# Patient Record
Sex: Male | Born: 1960 | Race: White | Hispanic: No | Smoking: Former smoker
Health system: Southern US, Community
[De-identification: ages and names within clinical notes are randomized; demographics above are authoritative.]

## PROBLEM LIST (undated history)

## (undated) DIAGNOSIS — E7800 Pure hypercholesterolemia, unspecified: Secondary | ICD-10-CM

## (undated) HISTORY — PX: CHOLECYSTECTOMY: SHX55

---

## 2008-03-03 ENCOUNTER — Emergency Department (HOSPITAL_BASED_OUTPATIENT_CLINIC_OR_DEPARTMENT_OTHER): Admission: EM | Admit: 2008-03-03 | Discharge: 2008-03-03 | Payer: Self-pay | Admitting: Emergency Medicine

## 2009-10-12 ENCOUNTER — Encounter: Admission: RE | Admit: 2009-10-12 | Discharge: 2009-10-12 | Payer: Self-pay | Admitting: Family Medicine

## 2010-01-17 ENCOUNTER — Encounter: Admission: RE | Admit: 2010-01-17 | Discharge: 2010-01-17 | Payer: Self-pay | Admitting: Family Medicine

## 2010-03-02 ENCOUNTER — Encounter: Admission: RE | Admit: 2010-03-02 | Discharge: 2010-03-02 | Payer: Self-pay | Admitting: Family Medicine

## 2010-04-06 ENCOUNTER — Encounter: Admission: RE | Admit: 2010-04-06 | Discharge: 2010-04-06 | Payer: Self-pay | Admitting: Family Medicine

## 2010-08-14 ENCOUNTER — Other Ambulatory Visit: Payer: Self-pay | Admitting: Family Medicine

## 2010-08-14 DIAGNOSIS — M542 Cervicalgia: Secondary | ICD-10-CM

## 2010-08-17 ENCOUNTER — Ambulatory Visit
Admission: RE | Admit: 2010-08-17 | Discharge: 2010-08-17 | Disposition: A | Discharge status: Home / Self Care | Payer: BC Managed Care – PPO | Source: Ambulatory Visit | Attending: Family Medicine | Admitting: Family Medicine

## 2010-08-17 DIAGNOSIS — M542 Cervicalgia: Secondary | ICD-10-CM

## 2011-02-19 ENCOUNTER — Emergency Department (HOSPITAL_BASED_OUTPATIENT_CLINIC_OR_DEPARTMENT_OTHER)
Admission: EM | Admit: 2011-02-19 | Discharge: 2011-02-19 | Disposition: A | Discharge status: Home / Self Care | Payer: BC Managed Care – PPO | Attending: Emergency Medicine | Admitting: Emergency Medicine

## 2011-02-19 ENCOUNTER — Emergency Department (INDEPENDENT_AMBULATORY_CARE_PROVIDER_SITE_OTHER): Payer: BC Managed Care – PPO

## 2011-02-19 ENCOUNTER — Other Ambulatory Visit: Payer: Self-pay

## 2011-02-19 ENCOUNTER — Encounter: Payer: Self-pay | Admitting: *Deleted

## 2011-02-19 DIAGNOSIS — R1013 Epigastric pain: Secondary | ICD-10-CM | POA: Insufficient documentation

## 2011-02-19 DIAGNOSIS — R079 Chest pain, unspecified: Secondary | ICD-10-CM | POA: Insufficient documentation

## 2011-02-19 DIAGNOSIS — R109 Unspecified abdominal pain: Secondary | ICD-10-CM

## 2011-02-19 DIAGNOSIS — E7800 Pure hypercholesterolemia, unspecified: Secondary | ICD-10-CM | POA: Insufficient documentation

## 2011-02-19 HISTORY — DX: Pure hypercholesterolemia, unspecified: E78.00

## 2011-02-19 LAB — DIFFERENTIAL
Basophils Absolute: 0 10*3/uL (ref 0.0–0.1)
Lymphocytes Relative: 30 % (ref 12–46)
Monocytes Relative: 9 % (ref 3–12)
Neutro Abs: 5.8 10*3/uL (ref 1.7–7.7)
Neutrophils Relative %: 60 % (ref 43–77)

## 2011-02-19 LAB — COMPREHENSIVE METABOLIC PANEL
ALT: 51 U/L (ref 0–53)
AST: 54 U/L — ABNORMAL HIGH (ref 0–37)
CO2: 25 mEq/L (ref 19–32)
Calcium: 9.5 mg/dL (ref 8.4–10.5)
Chloride: 103 mEq/L (ref 96–112)
GFR calc non Af Amer: >60 mL/min (ref >60)
Glucose, Bld: 103 mg/dL — ABNORMAL HIGH (ref 70–99)
Total Bilirubin: 0.3 mg/dL (ref 0.3–1.2)
Total Protein: 7 g/dL (ref 6.0–8.3)

## 2011-02-19 LAB — CBC
MCHC: 35.5 g/dL (ref 30.0–36.0)
Platelets: 223 10*3/uL (ref 150–400)
RBC: 5.09 MIL/uL (ref 4.22–5.81)
RDW: 12.3 % (ref 11.5–15.5)

## 2011-02-19 LAB — URINALYSIS, ROUTINE W REFLEX MICROSCOPIC
Bilirubin Urine: NEGATIVE
Hgb urine dipstick: NEGATIVE
Ketones, ur: NEGATIVE mg/dL
Protein, ur: NEGATIVE mg/dL
Urobilinogen, UA: 0.2 mg/dL (ref 0.0–1.0)

## 2011-02-19 LAB — LIPASE, BLOOD: Lipase: 41 U/L (ref 11–59)

## 2011-02-19 MED ORDER — DOCUSATE SODIUM 100 MG PO CAPS: 100.0000 mg | ORAL_CAPSULE | Freq: Two times a day (BID) | ORAL | Status: AC

## 2011-02-19 MED ORDER — FAMOTIDINE 20 MG PO TABS: 20.0000 mg | ORAL_TABLET | Freq: Two times a day (BID) | ORAL | Status: DC

## 2011-02-19 NOTE — ED Provider Notes (Signed)
History    Scribed for Forbes Cellar, MD, the patient was seen in room MH01/MH01. This chart was scribed by Clarita Crane. This patient's care was started at 6:01PM  CSN: [id number] Arrival date & time: 02/19/2011  4:57 PM  Chief Complaint  Patient presents with  . Abdominal Pain   HPI Patient is a 50 year old male c/o sudden onset episodic upper abdominal pain described as sharp and cramping with associated diaphoresis. Patient reports he had an episode of abdominal pain this morning several minutes following breakfast and an additional episode this afternoon 5 minutes following a meal of spaghetti. Patient states both episodes lasted approximately 5-10 minutes and then resolved on their own. Denies nausea, vomiting, back pain, dysuria, hematuria. Patient denies previous history of similar symptoms.  Reports last and only BM today was this morning and states he usually has 3 BMs a day. Reports h/o hyperlipidemia.  Patient is an occasional drinker, former smoker and denies drug abuse. No blood in stool  PAST MEDICAL HISTORY:  Past Medical History  Diagnosis Date  . High cholesterol     PAST SURGICAL HISTORY:  History reviewed. No pertinent past surgical history.  MEDICATIONS:  Previous Medications   ACYCLOVIR (ZOVIRAX) 400 MG TABLET    Take 400 mg by mouth 3 (three) times daily as needed. Fever blister    ALPRAZOLAM (XANAX) 0.5 MG TABLET    Take 0.5 mg by mouth at bedtime.     AMITRIPTYLINE (ELAVIL) 10 MG TABLET    Take 20 mg by mouth at bedtime.     ASPIRIN 81 MG EC TABLET    Take 81 mg by mouth daily.     EZETIMIBE (ZETIA) 10 MG TABLET    Take 10 mg by mouth daily.     MULTIPLE VITAMIN (MULTIVITAMIN) TABLET    Take 1 tablet by mouth daily.     NUTRITIONAL SUPPLEMENTS (PROSTA VITE PO)    Take 1 tablet by mouth daily.     PITAVASTATIN CALCIUM (LIVALO) 2 MG TABS    Take 1 tablet by mouth daily.       ALLERGIES:  Allergies as of 02/19/2011 - Review Complete 02/19/2011  Allergen  Reaction Noted  . Niaspan (niacin (antihyperlipidemic)) Hives and Itching 02/19/2011     FAMILY HISTORY:  No family history on file.   SOCIAL HISTORY: History   Social History  . Marital Status: Divorced    Spouse Name: N/A    Number of Children: N/A  . Years of Education: N/A   Social History Main Topics  . Smoking status: Former Games developer  . Smokeless tobacco: None  . Alcohol Use: Yes  . Drug Use: No  . Sexually Active:    Other Topics Concern  . None   Social History Narrative  . None     Review of Systems 10 Systems reviewed and are negative for acute change except as noted in the HPI.  Physical Exam  BP 130/75  Pulse 85  Temp(Src) 97.9 F (36.6 C) (Oral)  Resp 20  SpO2 99%  Physical Exam  Nursing note and vitals reviewed. Constitutional: He is oriented to person, place, and time. Vital signs are normal. He appears well-developed and well-nourished.  HENT:  Head: Normocephalic and atraumatic.       Oropharynx clear  Eyes: Conjunctivae are normal. Pupils are equal, round, and reactive to light.  Neck: Neck supple.  Cardiovascular: Normal rate, regular rhythm and normal heart sounds.  Exam reveals no gallop and no friction  rub.   No murmur heard. Pulmonary/Chest: Effort normal and breath sounds normal. He has no wheezes. He has no rales.       No rhonchi  Abdominal: Soft. Bowel sounds are normal. He exhibits no distension. There is no tenderness. There is no CVA tenderness.  Musculoskeletal: Normal range of motion. He exhibits no edema.  Neurological: He is alert and oriented to person, place, and time. No sensory deficit.  Skin: Skin is warm and dry.  Psychiatric: He has a normal mood and affect. His behavior is normal.    ED Course  Procedures  OTHER DATA REVIEWED: Nursing notes, vital signs, and past medical records reviewed. All labs/vitals reviewed and considered Imaging results reviewed and considered   DIAGNOSTIC STUDIES:   LABS /  RADIOLOGY: Results for orders placed during the hospital encounter of 02/19/11  CBC      Component Value Range   WBC 9.7  4.0 - 10.5 (K/uL)   RBC 5.09  4.22 - 5.81 (MIL/uL)   Hemoglobin 15.6  13.0 - 17.0 (g/dL)   HCT 16.1  09.6 - 04.5 (%)   MCV 86.4  78.0 - 100.0 (fL)   MCH 30.6  26.0 - 34.0 (pg)   MCHC 35.5  30.0 - 36.0 (g/dL)   RDW 40.9  81.1 - 91.4 (%)   Platelets 223  150 - 400 (K/uL)  DIFFERENTIAL      Component Value Range   Neutrophils Relative 60  43 - 77 (%)   Neutro Abs 5.8  1.7 - 7.7 (K/uL)   Lymphocytes Relative 30  12 - 46 (%)   Lymphs Abs 2.9  0.7 - 4.0 (K/uL)   Monocytes Relative 9  3 - 12 (%)   Monocytes Absolute 0.8  0.1 - 1.0 (K/uL)   Eosinophils Relative 1  0 - 5 (%)   Eosinophils Absolute 0.1  0.0 - 0.7 (K/uL)   Basophils Relative 0  0 - 1 (%)   Basophils Absolute 0.0  0.0 - 0.1 (K/uL)  COMPREHENSIVE METABOLIC PANEL      Component Value Range   Sodium 140  135 - 145 (mEq/L)   Potassium 3.9  3.5 - 5.1 (mEq/L)   Chloride 103  96 - 112 (mEq/L)   CO2 25  19 - 32 (mEq/L)   Glucose, Bld 103 (*) 70 - 99 (mg/dL)   BUN 15  6 - 23 (mg/dL)   Creatinine, Ser 7.82  0.50 - 1.35 (mg/dL)   Calcium 9.5  8.4 - 95.6 (mg/dL)   Total Protein 7.0  6.0 - 8.3 (g/dL)   Albumin 4.2  3.5 - 5.2 (g/dL)   AST 54 (*) 0 - 37 (U/L)   ALT 51  0 - 53 (U/L)   Alkaline Phosphatase 81  39 - 117 (U/L)   Total Bilirubin 0.3  0.3 - 1.2 (mg/dL)   GFR calc non Af Amer >60  >60 (mL/min)   GFR calc Af Amer >60  >60 (mL/min)  LIPASE, BLOOD      Component Value Range   Lipase 41  11 - 59 (U/L)  URINALYSIS, ROUTINE W REFLEX MICROSCOPIC      Component Value Range   Color, Urine YELLOW  YELLOW    Appearance CLOUDY (*) CLEAR    Specific Gravity, Urine 1.023  1.005 - 1.030    pH 6.5  5.0 - 8.0    Glucose, UA NEGATIVE  NEGATIVE (mg/dL)   Hgb urine dipstick NEGATIVE  NEGATIVE    Bilirubin Urine NEGATIVE  NEGATIVE    Ketones, ur NEGATIVE  NEGATIVE (mg/dL)   Protein, ur NEGATIVE  NEGATIVE (mg/dL)    Urobilinogen, UA 0.2  0.0 - 1.0 (mg/dL)   Nitrite NEGATIVE  NEGATIVE    Leukocytes, UA NEGATIVE  NEGATIVE    Dg Abd Acute W/chest  02/19/2011  *RADIOLOGY REPORT*  Clinical Data: Abdominal and chest pain  ACUTE ABDOMEN SERIES (ABDOMEN 2 VIEW & CHEST 1 VIEW)  Comparison: 01/17/2010 chest x-ray  Findings: Normal heart size and vascularity.  Negative for pneumonia, edema, effusion or pneumothorax.  Midline trachea.  Nonobstructive bowel gas pattern.  No significant dilatation, ileus or free air.  Pelvic calcifications likely venous phleboliths.  No acute osseous finding.  IMPRESSION: No acute finding.  Original Report Authenticated By: Judie Petit. Ruel Favors, M.D.    Date: 02/19/2011  Rate: 72  Rhythm: normal sinus rhythm  QRS Axis: normal  Intervals: normal  ST/T Wave abnormalities: nonspecific T wave changes  Conduction Disutrbances:none  Narrative Interpretation:   Old EKG Reviewed: none available  PROCEDURES:  ED COURSE / COORDINATION OF CARE: 7:42 PM  Pt continues to remain pain free in ER. Tolerating PO without pain. Feels comfortable with discharge, has PMD f/u one week. Precautions for return. Home with pepcid and colace   MDM: Differential Diagnosis: gastritis, gastric ulcer, GI spasm, biliary colic, less likely cardiac in nature    PLAN: discharge The patient is to return the emergency department if there is any worsening of symptoms. I have reviewed the discharge instructions with the patient/family   CONDITION ON DISCHARGE: Stable,    MEDICATIONS GIVEN IN THE E.D.  Medications  aspirin 81 MG EC tablet (not administered)  Nutritional Supplements (PROSTA VITE PO) (not administered)  Multiple Vitamin (MULTIVITAMIN) tablet (not administered)  ezetimibe (ZETIA) 10 MG tablet (not administered)  Pitavastatin Calcium (LIVALO) 2 MG TABS (not administered)  amitriptyline (ELAVIL) 10 MG tablet (not administered)  ALPRAZolam (XANAX) 0.5 MG tablet (not administered)  acyclovir  (ZOVIRAX) 400 MG tablet (not administered)     I personally performed the services described in this documentation, which was scribed in my presence. The recorded information has been reviewed and considered. Forbes Cellar, MD     Forbes Cellar, MD 02/19/11 [phone]

## 2011-02-19 NOTE — ED Notes (Signed)
Sudden onset of epigastric pain this am after eating breakfast. Resolved with time. Same thing happened after eating lunch. His MD is out of town and he was told to come here.

## 2011-02-19 NOTE — ED Notes (Signed)
Pt given crackers, peanut butter, and soda per MD order for PO challenge.

## 2011-02-20 ENCOUNTER — Other Ambulatory Visit: Payer: Self-pay | Admitting: Family Medicine

## 2011-02-20 DIAGNOSIS — R1010 Upper abdominal pain, unspecified: Secondary | ICD-10-CM

## 2011-02-20 DIAGNOSIS — R61 Generalized hyperhidrosis: Secondary | ICD-10-CM

## 2011-02-21 ENCOUNTER — Observation Stay (HOSPITAL_COMMUNITY)
Admission: EM | Admit: 2011-02-21 | Discharge: 2011-02-23 | DRG: 419 | Disposition: A | Discharge status: Home / Self Care | Payer: BC Managed Care – PPO | Attending: General Surgery | Admitting: General Surgery

## 2011-02-21 ENCOUNTER — Ambulatory Visit
Admission: RE | Admit: 2011-02-21 | Discharge: 2011-02-21 | Disposition: A | Discharge status: Home / Self Care | Payer: BC Managed Care – PPO | Source: Ambulatory Visit | Attending: Family Medicine | Admitting: Family Medicine

## 2011-02-21 DIAGNOSIS — Z79899 Other long term (current) drug therapy: Secondary | ICD-10-CM | POA: Insufficient documentation

## 2011-02-21 DIAGNOSIS — R109 Unspecified abdominal pain: Secondary | ICD-10-CM | POA: Insufficient documentation

## 2011-02-21 DIAGNOSIS — K8010 Calculus of gallbladder with chronic cholecystitis without obstruction: Principal | ICD-10-CM | POA: Insufficient documentation

## 2011-02-21 DIAGNOSIS — E785 Hyperlipidemia, unspecified: Secondary | ICD-10-CM | POA: Diagnosis present

## 2011-02-21 DIAGNOSIS — R61 Generalized hyperhidrosis: Secondary | ICD-10-CM

## 2011-02-21 DIAGNOSIS — R1011 Right upper quadrant pain: Secondary | ICD-10-CM

## 2011-02-21 DIAGNOSIS — R1010 Upper abdominal pain, unspecified: Secondary | ICD-10-CM

## 2011-02-21 DIAGNOSIS — Z7982 Long term (current) use of aspirin: Secondary | ICD-10-CM | POA: Insufficient documentation

## 2011-02-21 LAB — COMPREHENSIVE METABOLIC PANEL
ALT: 171 U/L — ABNORMAL HIGH (ref 0–53)
Albumin: 4.3 g/dL (ref 3.5–5.2)
Alkaline Phosphatase: 131 U/L — ABNORMAL HIGH (ref 39–117)
Chloride: 102 mEq/L (ref 96–112)
Creatinine, Ser: 0.89 mg/dL (ref 0.50–1.35)
Total Protein: 7.3 g/dL (ref 6.0–8.3)

## 2011-02-21 LAB — DIFFERENTIAL
Basophils Absolute: 0 10*3/uL (ref 0.0–0.1)
Eosinophils Relative: 1 % (ref 0–5)
Lymphs Abs: 2.3 10*3/uL (ref 0.7–4.0)
Monocytes Relative: 8 % (ref 3–12)
Neutrophils Relative %: 60 % (ref 43–77)

## 2011-02-21 LAB — CBC
MCH: 31.2 pg (ref 26.0–34.0)
MCHC: 35.8 g/dL (ref 30.0–36.0)
Platelets: 223 10*3/uL (ref 150–400)
WBC: 7.4 10*3/uL (ref 4.0–10.5)

## 2011-02-22 ENCOUNTER — Other Ambulatory Visit (INDEPENDENT_AMBULATORY_CARE_PROVIDER_SITE_OTHER): Payer: Self-pay | Admitting: General Surgery

## 2011-02-22 ENCOUNTER — Inpatient Hospital Stay (HOSPITAL_COMMUNITY): Payer: BC Managed Care – PPO

## 2011-02-22 DIAGNOSIS — K8010 Calculus of gallbladder with chronic cholecystitis without obstruction: Secondary | ICD-10-CM

## 2011-02-22 LAB — CBC
Hemoglobin: 14.6 g/dL (ref 13.0–17.0)
MCH: 29.7 pg (ref 26.0–34.0)
MCV: 88.6 fL (ref 78.0–100.0)
Platelets: 209 10*3/uL (ref 150–400)
RBC: 4.92 MIL/uL (ref 4.22–5.81)

## 2011-02-22 LAB — SURGICAL PCR SCREEN
MRSA, PCR: NEGATIVE
Staphylococcus aureus: NEGATIVE

## 2011-02-22 LAB — COMPREHENSIVE METABOLIC PANEL
CO2: 27 mEq/L (ref 19–32)
Calcium: 8.8 mg/dL (ref 8.4–10.5)
Creatinine, Ser: 0.91 mg/dL (ref 0.50–1.35)
GFR calc Af Amer: >60 mL/min (ref >60)
GFR calc non Af Amer: >60 mL/min (ref >60)
Glucose, Bld: 95 mg/dL (ref 70–99)

## 2011-02-24 NOTE — Op Note (Signed)
NAME:  Darren Martin, Darren Martin NO.:  [id number]  MEDICAL RECORD NO.:  [id number]  LOCATION:  1523                         FACILITY:  [facility]  PHYSICIAN:  Lodema Pilot, MD       DATE OF BIRTH:  1960-09-17  DATE OF PROCEDURE:  02/22/2011 DATE OF DISCHARGE:                              OPERATIVE REPORT   PROCEDURE:  Laparoscopic cholecystectomy with intraoperative cholangiogram.  PREOPERATIVE DIAGNOSIS:  Symptomatic cholelithiasis.  POSTOPERATIVE DIAGNOSIS:  Symptomatic cholelithiasis.  SURGEON:  Lodema Pilot, MD  ASSISTANT:  None.  ANESTHESIA:  General endotracheal anesthesia with 30 cc of 1% lidocaine with epinephrine and 0.25% Marcaine in a 50/50 mixture.  FLUIDS:  1350 cc of crystalloid.  ESTIMATED BLOOD LOSS:  Minimal.  DRAINS:  None.  SPECIMEN:  Gallbladder and contents sent to Pathology for permanent sectioning.  COMPLICATIONS:  None apparent.  FINDINGS:  Multiple small gallstones within the gallbladder, normal cholangiogram.  No other obvious intra-abdominal pathology was identified.  INDICATIONS FOR PROCEDURE:  Mr. Schroeter is a 50 year old male with persistent right upper quadrant pain after eating.  He was unable to tolerate p.o. due to episodic pain after eating.  He had an ultrasound, consistent with cholelithiasis.  He actually also had some elevated LFTs on his most recent labs.  OPERATIVE DETAILS:  Mr. Vercher was seen and evaluated in the preoperative area and risks and benefits of the procedure were again discussed in lay terms.  Informed consent was obtained. He was given prophylactic antibiotics, taken to the operating room, placed on table in supine position, and general endotracheal anesthesia was obtained.  His abdomen was prepped and draped in a standard surgical fashion and semicircular infraumbilical incision was made in the skin and dissection carried down to the subcutaneous tissue using blunt dissection.  The abdominal wall fascia  was elevated between Kocher clamps and sharply incised and the peritoneum was entered under direct visualization.  A 12 mm balloon trocar was placed at the umbilicus and pneumoperitoneum was obtained. Laparoscope was introduced into the abdomen and there was no evidence of bowel injury upon entry.  An 11 mm epigastric trocar was placed and two 5 mm right upper quadrant trocars were placed under direct visualization.  The gallbladder fundus was retracted cephalad and the peritoneum overlying the gallbladder was taken down using blunt dissection.  He did not appear to have acute cholecystitis, although he did have a thickened wall, but the surrounding tissue was not edematous or inflamed and the anatomy was clearly visualized.  The cystic duct and cystic artery were identified in the usual anatomic positions and the triangle of Calot was skeletonized, the cystic duct was skeletonized. The liver parenchyma was visualized through the triangle of Calot with a single cystic artery coursing up on to the gallbladder.  Clips were placed on the artery prior to the cholangiogram, however, the artery was not divided.  A clip was placed on the cystic duct on the gallbladder side and cholangiogram catheter was placed to a 14-gauge angiocatheter through the abdominal wall and a small cystic ductotomy was made.  The catheter was placed into the cystic duct and cholangiogram was performed.  There was normal filling of  the right and left hepatic ducts.  No common bile duct filling defects and there was free flow of bowel into the duodenum, although he did appear to have a dilated common bile duct.  The cholangiogram catheter was removed and clips were placed on the cystic duct and the duct was transected as well as the artery that had already been clipped.  The gallbladder was removed from the gallbladder fossa using Bovie electrocautery and it was removed from the umbilicus in an EndoCatch bag that was  opened up on the back table and he did have multiple gallstones by the single cystic duct.  Specimen was passed off the table and sent to Pathology for permanent sectioning. During the dissection from the gallbladder fossa, the clip on the gallbladder side of the cystic duct had come off and a few stones had leaked out and these were removed using a stone forceps.  The gallbladder fossa was inspected for hemostasis which was noted to be adequate and the clips appeared to be in good position.  The right upper quadrant was irrigated with sterile saline solution until irrigation returned clear.  There was no evidence of any residual stones. The right upper quadrant trocar was removed under visualization and the umbilical trocars were removed and the fascia was closed using interrupted 0 Vicryl sutures.  The abdomen was re-insufflated and the camera placed through the epigastric trocar and the umbilical closure was noted to be adequate without any evidence of bowel injury.  The right upper quadrant appeared to be hemostatic and no evidence of bowel injury.  Epigastric trocar was removed and the wounds were injected with total of 30 cc of 1% lidocaine with epinephrine and 0.25% Marcaine in 50/50 mixture.  The skin edges were approximated with 4-0 Monocryl subcuticular suture.  The skin was washed and dried and Dermabond was applied.  All sponge, needle, and instrument counts were correct at the end of the case.  The patient tolerated procedure well and was ready for transfer to recovery room in stable condition.          ______________________________ Lodema Pilot, MD     BL/MEDQ  D:  02/22/2011  T:  02/23/2011  Job:  [id number]  Electronically Signed by Lodema Pilot DO on 02/24/2011 06:02:56 PM

## 2011-03-06 NOTE — H&P (Signed)
NAME:  Darren Martin, KUNZ NO.:  [id number]  MEDICAL RECORD NO.:  [id number]  LOCATION:  WLED                         FACILITY:  [facility]  PHYSICIAN:  Lodema Pilot, MD       DATE OF BIRTH:  Oct 18, 1960  DATE OF ADMISSION:  02/21/2011 DATE OF DISCHARGE:                             HISTORY & PHYSICAL   REFERRING PHYSICIAN:  Mosetta Putt, M.D.  REASON FOR CONSULT:  Abdominal pain and gallstones.  BRIEF HISTORY:  The patient is a 50 year old white male, who is normally in good health until Monday when he thought he was having a heart attack after eating.  The pain got better quickly and he felt better, but he went to the ER at [facility] and was ruled out from a cardiac standpoint.  Tuesday, August 7, he had pain again after breakfast that was sharp and lasted about 5-10 minutes.  Pain improved and then he was seen again today again with pain and underwent ultrasound which shows he had gallstones.  Now, he has been referred to the ER at [facility] and Dr. Biagio Quint for evaluation for cholecystectomy.  He reports having fullness after eating a poor appetite for about a week.  Currently, he is nontender and having no discomfort.  PAST MEDICAL HISTORY: 1. 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 2. Dyslipidemia.  PAST SURGICAL HISTORY:  None.  FAMILY HISTORY:  Mother is living in good health.  Father died with renal disease.  One brother and sister both in good health.  SOCIAL HISTORY:  Tobacco, none.  Alcohol, social.  Drugs, none.  He is married.  He is a Media planner.  REVIEW OF SYSTEMS:  FEVER:  None.  SKIN:  No changes.  PSYCH:  No changes.  WEIGHT:  Stable.  CV: No headaches, dizziness, syncope, or stroke.  PULMONARY:  He snores, but no other problems.  No orthopnea, PND, DOE.  CARDIAC:  No chest pain.  No palpitations.  GI: Positive for occasional GERD.  Positive for constipation.  No diarrhea.  No nausea or vomiting.  GU: No trouble  voiding.  LOWER EXTREMITIES: No edema. MUSCULOSKELETAL: Some joint problems.  CURRENT MEDICATIONS: 1. Zetia 10 mg daily. 2. Aspirin 81 mg daily. 3. Prosta-Response one daily. 4. Livalo 2 mg daily. 5. Multivitamin one daily. 6. Elavil 20 mg h.s. 7. Alprazolam 0.5 mg at h.s. 8. Acyclovir 400 p.r.n. for fever blisters.  ALLERGIES:  NIASPAN, he had hives and trouble breathing.  He has been statin intolerant.  He has been on several others with muscular problems with them.  PHYSICAL EXAMINATION:  GENERAL:  This is a well-nourished, well- developed white male in no acute distress. VITAL SIGNS:  He is 90.9 kg, temperature is 97.8, heart rate is 81, blood pressure is 141/85, respiratory rate of 16, sats are 99% on room air. EARS, NOSE, THROAT AND MOUTH:  Within normal limits. NECK:  Trachea is in midline.  No bruits.  No JVD.  No thyromegaly. RESPIRATORY:  Effort is normal. CHEST: Clear to auscultation and percussion.  Chest is nontender to palpation CARDIAC:  No murmurs or rubs were heard.  Normal S1 and S2.  Pulses  are +2 and equal both the upper and lower extremities. ABDOMEN:  Soft, positive bowel sounds.  No palpable hepatosplenomegaly. He is nontender, nondistended.  No abscesses, hernia, or masses. GU/RECTAL:  Deferred. LYMPHADENOPATHY:  None palpated in femoral, axillary, or cervical. MUSCULOSKELETAL:  No joint changes. SKIN:  No changes. NEUROLOGIC:  Cranial nerves II-XII intact.  No focal defects. PSYCH:  Normal affect.  LABORATORY DATA:  His white count on August 6, is 9.7.  Hemoglobin 15.6, hematocrit 44, platelets are 223,000.  UA is negative.  Total bilirubin was 0.3, alk phos was 81, SGOT is 54, SGPT is 51, these have been repeated and his total bilirubin is 0.5, alk phos is up to 131, SGOT up to 153 and SGPT is 171.  Electrolytes are normal.  BUN is 14, creatinine is 0.89.  Ultrasound shows cholelithiasis and the largest stone is up to 9 mm, gallbladder wall was  normal.  There is no fluid, Murphy sign was negative, common bile duct was dilated at 7.5 mm.  IMPRESSION: 1. Symptomatic cholelithiasis. 2. Elevated transaminases. 3. Dyslipidemia.  PLAN:  We are going to admit the patient, well hydrate him overnight and recheck a CMP in the morning, tentatively schedule for OR some time tomorrow.     Eber Hong, P.A.   ______________________________ Lodema Pilot, MD    WDJ/MEDQ  D:  02/21/2011  T:  02/21/2011  Job:  [id number]  cc:   Mosetta Putt, M.D. Fax: [phone]  Electronically Signed by Lodema Pilot DO on 03/06/2011 09:45:24 AM

## 2011-03-13 ENCOUNTER — Ambulatory Visit (INDEPENDENT_AMBULATORY_CARE_PROVIDER_SITE_OTHER): Payer: BC Managed Care – PPO | Admitting: Radiology

## 2011-03-13 ENCOUNTER — Encounter (INDEPENDENT_AMBULATORY_CARE_PROVIDER_SITE_OTHER): Payer: Self-pay | Admitting: Radiology

## 2011-03-13 DIAGNOSIS — K8010 Calculus of gallbladder with chronic cholecystitis without obstruction: Secondary | ICD-10-CM

## 2011-03-13 NOTE — Progress Notes (Signed)
Darren Martin is a 50 y.o. male who had a laparoscopic cholecystectomy with intraoperative cholangiogram.  The pathology report confirmed gallstones and cholecystitis.  The patient reports that they are feeling well with normal bowel movements and good appetite.  The pre-operative symptoms of abdominal pain, nausea, and vomiting have resolved.    Physical examination - Incisions appear well-healed with no sign of infection or bleeding.   Abdomen - soft, non-tender  Impression:  s/p laparoscopic cholecystectomy  Plan:  He may resume a regular diet and full activity.  He may follow-up on a PRN basis.

## 2011-06-12 ENCOUNTER — Other Ambulatory Visit: Payer: Self-pay | Admitting: Family Medicine

## 2011-06-12 ENCOUNTER — Ambulatory Visit
Admission: RE | Admit: 2011-06-12 | Discharge: 2011-06-12 | Disposition: A | Discharge status: Home / Self Care | Payer: BC Managed Care – PPO | Source: Ambulatory Visit | Attending: Family Medicine | Admitting: Family Medicine

## 2011-06-12 DIAGNOSIS — R059 Cough, unspecified: Secondary | ICD-10-CM

## 2011-06-12 DIAGNOSIS — R0602 Shortness of breath: Secondary | ICD-10-CM

## 2011-06-12 DIAGNOSIS — R05 Cough: Secondary | ICD-10-CM

## 2011-07-24 ENCOUNTER — Other Ambulatory Visit: Payer: Self-pay | Admitting: Family Medicine

## 2011-07-24 ENCOUNTER — Ambulatory Visit
Admission: RE | Admit: 2011-07-24 | Discharge: 2011-07-24 | Disposition: A | Discharge status: Home / Self Care | Payer: BC Managed Care – PPO | Source: Ambulatory Visit | Attending: Family Medicine | Admitting: Family Medicine

## 2011-07-24 DIAGNOSIS — M545 Low back pain, unspecified: Secondary | ICD-10-CM

## 2012-02-22 ENCOUNTER — Other Ambulatory Visit: Payer: Self-pay | Admitting: Family Medicine

## 2012-02-22 DIAGNOSIS — M545 Low back pain, unspecified: Secondary | ICD-10-CM

## 2012-02-27 ENCOUNTER — Ambulatory Visit
Admission: RE | Admit: 2012-02-27 | Discharge: 2012-02-27 | Disposition: A | Discharge status: Home / Self Care | Payer: 59 | Source: Ambulatory Visit | Attending: Family Medicine | Admitting: Family Medicine

## 2012-02-27 DIAGNOSIS — M545 Low back pain, unspecified: Secondary | ICD-10-CM

## 2012-02-29 ENCOUNTER — Other Ambulatory Visit: Payer: Self-pay | Admitting: Family Medicine

## 2012-02-29 ENCOUNTER — Ambulatory Visit
Admission: RE | Admit: 2012-02-29 | Discharge: 2012-02-29 | Disposition: A | Discharge status: Home / Self Care | Payer: 59 | Source: Ambulatory Visit | Attending: Family Medicine | Admitting: Family Medicine

## 2012-02-29 DIAGNOSIS — M545 Low back pain, unspecified: Secondary | ICD-10-CM

## 2012-02-29 MED ORDER — IOHEXOL 180 MG/ML  SOLN
1.0000 mL | Freq: Once | INTRAMUSCULAR | Status: AC | PRN
  Administered 2012-02-29: 1 mL via EPIDURAL

## 2012-02-29 MED ORDER — METHYLPREDNISOLONE ACETATE 40 MG/ML INJ SUSP (RADIOLOG
120.0000 mg | Freq: Once | INTRAMUSCULAR | Status: AC
  Administered 2012-02-29: 120 mg via EPIDURAL

## 2012-02-29 NOTE — Discharge Instructions (Signed)

## 2012-03-13 ENCOUNTER — Other Ambulatory Visit: Payer: Self-pay | Admitting: Family Medicine

## 2012-03-13 DIAGNOSIS — M5416 Radiculopathy, lumbar region: Secondary | ICD-10-CM

## 2012-03-24 ENCOUNTER — Other Ambulatory Visit: Payer: Self-pay | Admitting: Family Medicine

## 2012-03-24 ENCOUNTER — Ambulatory Visit
Admission: RE | Admit: 2012-03-24 | Discharge: 2012-03-24 | Disposition: A | Discharge status: Home / Self Care | Payer: 59 | Source: Ambulatory Visit | Attending: Family Medicine | Admitting: Family Medicine

## 2012-03-24 DIAGNOSIS — M5416 Radiculopathy, lumbar region: Secondary | ICD-10-CM

## 2012-03-24 MED ORDER — METHYLPREDNISOLONE ACETATE 40 MG/ML INJ SUSP (RADIOLOG
120.0000 mg | Freq: Once | INTRAMUSCULAR | Status: AC
  Administered 2012-03-24: 120 mg via EPIDURAL

## 2012-03-24 MED ORDER — IOHEXOL 180 MG/ML  SOLN
1.0000 mL | Freq: Once | INTRAMUSCULAR | Status: AC | PRN
  Administered 2012-03-24: 1 mL via EPIDURAL

## 2012-04-08 ENCOUNTER — Telehealth: Payer: Self-pay | Admitting: Radiology

## 2012-04-08 NOTE — Telephone Encounter (Signed)
Pt having more pain post injection and wanted to speak with Dr. Benard Rink. Dr. Benard Rink made aware and he will call pt later today.dd

## 2012-10-28 ENCOUNTER — Ambulatory Visit (HOSPITAL_BASED_OUTPATIENT_CLINIC_OR_DEPARTMENT_OTHER): Payer: 59

## 2012-11-04 ENCOUNTER — Ambulatory Visit (HOSPITAL_BASED_OUTPATIENT_CLINIC_OR_DEPARTMENT_OTHER): Payer: 59

## 2016-04-17 ENCOUNTER — Ambulatory Visit (INDEPENDENT_AMBULATORY_CARE_PROVIDER_SITE_OTHER): Payer: 59 | Admitting: Podiatry

## 2016-04-17 ENCOUNTER — Encounter: Payer: Self-pay | Admitting: Podiatry

## 2016-04-17 DIAGNOSIS — B070 Plantar wart: Secondary | ICD-10-CM

## 2016-04-17 NOTE — Progress Notes (Signed)
Subjective:     Patient ID: Darren Martin, male   DOB: 02/15/1961, 55 y.o.   MRN: [id number]  HPI 55 year old male presents the office with concerns of a wart on the bottom of his left big toe which is been ongoing for about 2 years. He previously did have the area frozen one time he is been using over-the-counter wart remover pads without any relief. He states that he feels is getting bigger and spreading somewhat. Denies any pain or drainage. No other complaints at this time.  Review of Systems  All other systems reviewed and are negative.      Objective:   Physical Exam General: AAO x3, NAD  Dermatological: On the plantar Aspect of the left talus is a hyperkeratotic lesion and upon debridement as pinpoint bleeding evidence of verruca. This is approximate 1.5 x 1.5 cm there's multiple lesions identified. As 2 satellite lesions in the periphery. There is no edema, erythema to the hallux. No other lesions identified bilaterally.  Vascular: Dorsalis Pedis artery and Posterior Tibial artery pedal pulses are 2/4 bilateral with immedate capillary fill time. Pedal hair growth present. There is no pain with calf compression, swelling, warmth, erythema.   Neruologic: Grossly intact via light touch bilateral. Vibratory intact via tuning fork bilateral. Protective threshold with Semmes Wienstein monofilament intact to all pedal sites bilateral.  Musculoskeletal: No gross boney pedal deformities bilateral. No pain, crepitus, or limitation noted with foot and ankle range of motion bilateral. Muscular strength 5/5 in all groups tested bilateral.  Gait: Unassisted, Nonantalgic.      Assessment:     Verruca on the left hallux    Plan:     -Treatment options discussed including all alternatives, risks, and complications -Etiology of symptoms were discussed -Lesion was debrided today without complications. The area was cleaned. Pad was placed followed by salicylic acid and a bandage. Post  procedure instructions were discussed. Follow-up in 3 weeks or sooner if needed. Call any questions or concerns.  Ovid CurdMatthew Rasheed Martin, DPM

## 2016-05-08 ENCOUNTER — Ambulatory Visit (INDEPENDENT_AMBULATORY_CARE_PROVIDER_SITE_OTHER): Payer: 59 | Admitting: Podiatry

## 2016-05-08 ENCOUNTER — Encounter: Payer: Self-pay | Admitting: Podiatry

## 2016-05-08 DIAGNOSIS — B070 Plantar wart: Secondary | ICD-10-CM

## 2016-05-08 NOTE — Patient Instructions (Signed)
Take dressing off in 8 hours and wash the foot with soap and water. If it is hurting or becomes uncomfortable before the 8 hours, go ahead and remove the bandage and wash the area.  If it blisters, apply antibiotic ointment and a band-aid.  Monitor for any signs/symptoms of infection. Call the office immediately if any occur or go directly to the emergency room. Call with any questions/concerns.   

## 2016-05-09 NOTE — Progress Notes (Signed)
Subjective: Patient presents today for follow-up evaluation of the left hallux wart. He believes it is spreading. He had a piece of skin fall off of the area the other day. No drainage or redness. No complications after the salinocaine treatment last appointment. Denies any systemic complaints such as fevers, chills, nausea, vomiting. No acute changes since last appointment, and no other complaints at this time.   Objective: AAO x3, NAD DP/PT pulses palpable bilaterally, CRT less than 3 seconds Hyperkerotic lesion to the left plantar hallux. Upon debridement there is evidence of verruca. There is a new small satellite lesion adjacent to this area. There is no surrounding erythema, edema, ascending cellulitis, drainage, posturing signs of infection No open lesions or pre-ulcerative lesions.  No pain with calf compression, swelling, warmth, erythema  Assessment: Verruca left hallux  Plan: -All treatment options discussed with the patient including all alternatives, risks, complications.  -Lesion was debrided and the area was cleaned. Cantharone was applied followed by an occlusive bandage. Post procedure instructions were discussed. Monitor for infection. -Follow-up 4 weeks or sooner if needed. -Patient encouraged to call the office with any questions, concerns, change in symptoms.   Ovid CurdMatthew Russie Gulledge, DPM

## 2016-06-12 ENCOUNTER — Ambulatory Visit: Payer: 59 | Admitting: Podiatry

## 2016-06-19 ENCOUNTER — Encounter: Payer: Self-pay | Admitting: Podiatry

## 2016-06-19 ENCOUNTER — Ambulatory Visit (INDEPENDENT_AMBULATORY_CARE_PROVIDER_SITE_OTHER): Payer: 59 | Admitting: Podiatry

## 2016-06-19 DIAGNOSIS — B070 Plantar wart: Secondary | ICD-10-CM | POA: Diagnosis not present

## 2016-06-19 NOTE — Progress Notes (Signed)
Subjective: Patient presents today for follow-up evaluation of the left hallux wart. He has not looked at it and not sure how it is doing but it does not hurt and he has had no redness, swelling or drainage. He had no complications after the last treatment. No new concerns today.  Denies any systemic complaints such as fevers, chills, nausea, vomiting. No acute changes since last appointment, and no other complaints at this time.   Objective: AAO x3, NAD DP/PT pulses palpable bilaterally, CRT less than 3 seconds Hyperkerotic lesion to the left plantar hallux. Upon debridement there is evidence of verruca. Area. She smaller diameter as well as more superficial. There is no edema, erythema or signs of infections of the hallux. There is no other lesions identified. No pain with calf compression, swelling, warmth, erythema  Assessment: Verruca left hallux  Plan: -All treatment options discussed with the patient including all alternatives, risks, complications.  -Lesion was debrided and the area was cleaned. A pad was placed followed by some salinocaine followed by an occlusive bandage. Post procedure instructions were discussed. Monitor for infection. -Follow-up 4 weeks or sooner if needed. -Patient encouraged to call the office with any questions, concerns, change in symptoms.   Ovid CurdMatthew Wagoner, DPM

## 2016-07-24 ENCOUNTER — Ambulatory Visit (INDEPENDENT_AMBULATORY_CARE_PROVIDER_SITE_OTHER): Payer: 59 | Admitting: Podiatry

## 2016-07-24 ENCOUNTER — Encounter: Payer: Self-pay | Admitting: Podiatry

## 2016-07-24 DIAGNOSIS — B070 Plantar wart: Secondary | ICD-10-CM

## 2016-07-24 NOTE — Progress Notes (Signed)
Subjective: [name] presents today for follow-up evaluation of the left hallux wart. He has not looked at the area. It has not been causing any pain. This has been ongoing for several years now. Before seeing me, he has tried various OTC treatments and has had it frozen twice.   No new concerns today.  Denies any systemic complaints such as fevers, chills, nausea, vomiting. No acute changes since last appointment, and no other complaints at this time.   Objective: AAO x3, NAD DP/PT pulses palpable bilaterally, CRT less than 3 seconds Hyperkerotic lesion to the left plantar hallux. Upon debridement there is evidence of verruca. There is one larger area that is improved and there is too satellite lesions. Upon debridement there was pinpoint bleeding.There is no edema, erythema or signs of infections of the hallux. There is no other lesions identified. No pain with calf compression, swelling, warmth, erythema  Assessment: Verruca left hallux  Plan: -All treatment options discussed with the patient including all alternatives, risks, complications.  -Lesion was debrided and the area was cleaned. A pad was placed followed by salinocaine and an occlusive bandage. Post procedure instructions were discussed. Monitor for infection. -Prescribed [facility] compound cream wart treatment.  -Follow-up 6 weeks or sooner if needed. -Patient encouraged to call the office with any questions, concerns, change in symptoms.   Darren CurdMatthew Martin, DPM

## 2016-07-25 MED ORDER — NONFORMULARY OR COMPOUNDED ITEM: 2 refills | Status: DC

## 2016-07-25 NOTE — Addendum Note (Signed)
Addended by: Alphia Kava'CONNELL, VALERY D on: 07/25/2016 08:32 AM   Modules accepted: Orders

## 2016-08-28 ENCOUNTER — Ambulatory Visit (INDEPENDENT_AMBULATORY_CARE_PROVIDER_SITE_OTHER): Payer: 59 | Admitting: Podiatry

## 2016-08-28 ENCOUNTER — Encounter: Payer: Self-pay | Admitting: Podiatry

## 2016-08-28 DIAGNOSIS — B070 Plantar wart: Secondary | ICD-10-CM

## 2016-08-28 NOTE — Progress Notes (Signed)
Subjective: [name] presents today for follow-up evaluation of the left hallux wart. He states it does not hurt and he does not look at the areas so he does not know how it is doing. He did get the compound cream but states he has not used it in a couple of weeks [place]. No swelling, redness.  No new concerns today.  Denies any systemic complaints such as fevers, chills, nausea, vomiting. No acute changes since last appointment, and no other complaints at this time.   Objective: AAO x3, NAD DP/PT pulses palpable bilaterally, CRT less than 3 seconds Hyperkerotic lesion to the left plantar hallux. Upon debridement there is evidence of verruca. There is one larger area that appears to be improved. It appears to be more superficial. The satellite lesions that he had previously have almost completely resolved. . There is no other lesions identified. No pain with calf compression, swelling, warmth, erythema  Assessment: Verruca left hallux  Plan: -All treatment options discussed with the patient including all alternatives, risks, complications.  -Lesion was debrided and the area was cleaned. A pad was placed followed by salinocaine and an occlusive bandage. Post procedure instructions were discussed. Monitor for infection. -Continue compound cream for the wart  -Follow-up 6 weeks if it is still present or sooner if needed. -Patient encouraged to call the office with any questions, concerns, change in symptoms.   Darren Martin, DPM

## 2016-10-26 DIAGNOSIS — K4090 Unilateral inguinal hernia, without obstruction or gangrene, not specified as recurrent: Secondary | ICD-10-CM | POA: Diagnosis not present

## 2016-11-07 DIAGNOSIS — K4090 Unilateral inguinal hernia, without obstruction or gangrene, not specified as recurrent: Secondary | ICD-10-CM | POA: Diagnosis not present

## 2016-12-21 DIAGNOSIS — K4090 Unilateral inguinal hernia, without obstruction or gangrene, not specified as recurrent: Secondary | ICD-10-CM | POA: Diagnosis not present

## 2017-04-08 DIAGNOSIS — Z23 Encounter for immunization: Secondary | ICD-10-CM | POA: Diagnosis not present

## 2017-04-16 DIAGNOSIS — Z0000 Encounter for general adult medical examination without abnormal findings: Secondary | ICD-10-CM | POA: Diagnosis not present

## 2017-04-16 DIAGNOSIS — E7800 Pure hypercholesterolemia, unspecified: Secondary | ICD-10-CM | POA: Diagnosis not present

## 2018-04-22 DIAGNOSIS — Z125 Encounter for screening for malignant neoplasm of prostate: Secondary | ICD-10-CM | POA: Diagnosis not present

## 2018-04-22 DIAGNOSIS — Z23 Encounter for immunization: Secondary | ICD-10-CM | POA: Diagnosis not present

## 2018-04-22 DIAGNOSIS — E7800 Pure hypercholesterolemia, unspecified: Secondary | ICD-10-CM | POA: Diagnosis not present

## 2018-06-17 DIAGNOSIS — J0110 Acute frontal sinusitis, unspecified: Secondary | ICD-10-CM | POA: Diagnosis not present

## 2018-08-12 DIAGNOSIS — H43393 Other vitreous opacities, bilateral: Secondary | ICD-10-CM | POA: Diagnosis not present

## 2019-06-30 ENCOUNTER — Ambulatory Visit
Admission: EM | Admit: 2019-06-30 | Discharge: 2019-06-30 | Disposition: A | Discharge status: Home / Self Care | Payer: 59 | Attending: Family Medicine | Admitting: Family Medicine

## 2019-06-30 ENCOUNTER — Other Ambulatory Visit: Payer: Self-pay

## 2019-06-30 DIAGNOSIS — Z20822 Contact with and (suspected) exposure to covid-19: Secondary | ICD-10-CM

## 2019-06-30 DIAGNOSIS — Z7189 Other specified counseling: Secondary | ICD-10-CM

## 2019-06-30 DIAGNOSIS — Z20828 Contact with and (suspected) exposure to other viral communicable diseases: Secondary | ICD-10-CM

## 2019-06-30 NOTE — ED Provider Notes (Signed)
[place], [place]   Name: MALAKIE BALIS DOB: Feb 14, 1961 MRN: [id number] CSN: [id number] PCP: Kirby Funk, MD  Arrival date and time:  06/30/19 0955  Chief Complaint:  covid testing   NOTE: Prior to seeing the patient today, I have reviewed the triage nursing documentation and vital signs. Clinical staff has updated patient's PMH/PSHx, current medication list, and drug allergies/intolerances to ensure comprehensive history available to assist in medical decision making.   History:   HPI: HELEN CUFF is a 58 y.o. male who presents today with requests for SARS-CoV-2 (novel coronavirus) testing. Patient denies being in close contact with anyone known to be ill. He has not been tested for SARS-CoV-2 in the past 14 days; last tested negative in 01/2019 per his report. Patient presents today with no symptoms; no cough, fevers, or other symptoms commonly associated with SARS-CoV-2. He advises that he feels generally well. Patient presents for testing out of concern for his personal health. He adds that his daughter will not bring his grandchildren home for Christmas until he has documentation of a negative test.   Past Medical History:  Diagnosis Date  . High cholesterol     Past Surgical History:  Procedure Laterality Date  . CHOLECYSTECTOMY      History reviewed. No pertinent family history.  Social History   Tobacco Use  . Smoking status: Former Games developer  . Smokeless tobacco: Never Used  Substance Use Topics  . Alcohol use: Yes  . Drug use: No    Patient Active Problem List   Diagnosis Date Noted  . Plantar wart 08/28/2016    Home Medications:    Current Meds  Medication Sig  . aspirin 81 MG EC tablet Take 81 mg by mouth daily.    Marland Kitchen ezetimibe (ZETIA) 10 MG tablet Take 10 mg by mouth daily.    . Multiple Vitamin (MULTIVITAMIN) tablet Take 1 tablet by mouth daily.    . NONFORMULARY OR COMPOUNDED ITEM [facility]:  Wart cream - Cimetidine 2%, Deoxy-D-Glucose  0.2%, Fluorouracil-5 5%, Salicylic Acid 15%, apply to affected area daily.  . Nutritional Supplements (PROSTA VITE PO) Take 1 tablet by mouth daily.    . Pitavastatin Calcium (LIVALO) 2 MG TABS Take 1 tablet by mouth daily.      Allergies:   Niaspan [niacin er]  Review of Systems (ROS): Review of Systems  Constitutional: Negative for fatigue and fever.  HENT: Negative for congestion, ear pain, postnasal drip, rhinorrhea, sinus pressure, sinus pain, sneezing and sore throat.   Eyes: Negative for pain, discharge and redness.  Respiratory: Negative for cough, chest tightness and shortness of breath.   Cardiovascular: Negative for chest pain and palpitations.  Gastrointestinal: Negative for abdominal pain, diarrhea, nausea and vomiting.  Musculoskeletal: Negative for arthralgias, back pain, myalgias and neck pain.  Skin: Negative for color change, pallor and rash.  Neurological: Negative for dizziness, syncope, weakness and headaches.  Hematological: Negative for adenopathy.   Vital Signs: Today's Vitals   06/30/19 1013 06/30/19 1016  BP:  138/78  Pulse:  70  Resp:  16  Temp:  97.7 F (36.5 C)  TempSrc:  Oral  SpO2:  99%  Weight: 205 lb (93 kg)   Height: 6' (1.829 m)   PainSc: 0-No pain     Physical Exam: Physical Exam  Constitutional: She is oriented to person, place, and time and well-developed, well-nourished, and in no distress.  HENT:  Head: Normocephalic and atraumatic.  Eyes: Pupils are equal, round, and reactive to  light.  Cardiovascular: Normal rate, regular rhythm, normal heart sounds and intact distal pulses.  Pulmonary/Chest: Effort normal and breath sounds normal.  Neurological: She is alert and oriented to person, place, and time. Gait normal.  Skin: Skin is warm and dry. No rash noted. She is not diaphoretic.  Psychiatric: Mood, memory, affect and judgment normal.  Nursing note and vitals reviewed.  Urgent Care Treatments / Results:  LABS: PLEASE NOTE:  all labs that were ordered this encounter are listed, however only abnormal results are displayed. Labs Reviewed  NOVEL CORONAVIRUS, NAA (HOSP ORDER, SEND-OUT TO REF LAB; TAT 18-24 HRS)    EKG: -None  RADIOLOGY: No results found.  PROCEDURES: Procedures  MEDICATIONS RECEIVED THIS VISIT: Medications - No data to display  PERTINENT CLINICAL COURSE NOTES/UPDATES:   Initial Impression / Assessment and Plan / Urgent Care Course:  Pertinent labs & imaging results that were available during my care of the patient were personally reviewed by me and considered in my medical decision making (see lab/imaging section of note for values and interpretations).  YSMAEL HIRES is a 58 y.o. male who presents to [facility] today with requests for covid testing   Patient overall well appearing and in no acute distress today in clinic. Presenting symptoms (see HPI) and exam as documented above. He presents with requests for SARS-CoV-2 (novel coronavirus). Discussed typical symptom constellation and patient denies having any of the commonly associated symptoms. Patient requesting testing out of concern for personal health and health of his family; see HPI. SARS-CoV-2 swab collected by certified clinical staff. Discussed variable turn around times associated with testing, as swabs are being processed at [facility], and have been between 2-5 days to come back. He was advised to self quarantine, per [facility] DHHS guidelines, until negative results received.   Discussed follow up with primary care physician should he develop any concerning symptoms. I have reviewed the follow up and strict return precautions for any new or worsening symptoms. Patient is aware of symptoms that would be deemed urgent/emergent, and would thus require further evaluation either here or in the emergency department. At the time of discharge, he verbalized understanding and consent with the discharge plan as it was reviewed with him. All  questions were fielded by provider and/or clinic staff prior to patient discharge.    Final Clinical Impressions / Urgent Care Diagnoses:   Final diagnoses:  Encounter for laboratory testing for COVID-19 virus  Advice given about COVID-19 virus infection    New Prescriptions:  [place] Controlled Substance Registry consulted? Not Applicable  No orders of the defined types were placed in this encounter.   Recommended Follow up Care:  Patient encouraged to follow up with the following provider within the specified time frame, or sooner as dictated by the severity of his symptoms. As always, he was instructed that for any urgent/emergent care needs, he should seek care either here or in the emergency department for more immediate evaluation.  Follow-up Information    Lavone Orn, MD.   Specialty: Internal Medicine Why: As needed Contact information: [address] [place] [address] [phone]         NOTE: This note was prepared using Dragon dictation software along with smaller phrase technology. Despite my best ability to proofread, there is the potential that transcriptional errors may still occur from this process, and are completely unintentional.    Karen Kitchens, NP 06/30/19 2143

## 2019-06-30 NOTE — ED Triage Notes (Signed)
Patient states that he is here for Covid testing. Patient has no exposure or symptoms. Reports that his daughter wont come home for christmas until he has a negative test.

## 2019-06-30 NOTE — Discharge Instructions (Addendum)
It was very nice seeing you today in clinic. Thank you for entrusting me with your care.   You were tested for SARS-CoV-2 (novel coronavirus) today. Testing is performed by an outside lab ([facility]) and has variable turn around times ranging between 2-5 days. Current recommendations from the the CDC and [place] DHHS require that you remain at home until negative test results are have been received. In the event that your test results are positive, you will be contacted with further directives. These measures are being implemented out of an abundance of caution to prevent transmission and spread during the current SARS-CoV-2 pandemic.  If you develop any worsening symptoms or concerns, make arrangements to follow up with your regular doctor. If your symptoms are severe, please seek follow up care in the ER. Please remember, our [facility] providers are "right here with you" when you need us.   Again, it was my pleasure to take care of you today. Thank you for choosing our clinic. I hope that you start to feel better quickly.   Amore Ackman, MSN, APRN, FNP-C, CEN Advanced Practice Provider [facility] 

## 2019-07-01 LAB — NOVEL CORONAVIRUS, NAA (HOSP ORDER, SEND-OUT TO REF LAB; TAT 18-24 HRS): SARS-CoV-2, NAA: NOT DETECTED

## 2019-09-22 ENCOUNTER — Ambulatory Visit
Admission: EM | Admit: 2019-09-22 | Discharge: 2019-09-22 | Disposition: A | Discharge status: Home / Self Care | Payer: BC Managed Care – PPO | Attending: Family Medicine | Admitting: Family Medicine

## 2019-09-22 ENCOUNTER — Other Ambulatory Visit: Payer: Self-pay

## 2019-09-22 DIAGNOSIS — J019 Acute sinusitis, unspecified: Secondary | ICD-10-CM | POA: Diagnosis not present

## 2019-09-22 DIAGNOSIS — Z87891 Personal history of nicotine dependence: Secondary | ICD-10-CM | POA: Diagnosis not present

## 2019-09-22 DIAGNOSIS — Z79899 Other long term (current) drug therapy: Secondary | ICD-10-CM | POA: Diagnosis not present

## 2019-09-22 DIAGNOSIS — Z7982 Long term (current) use of aspirin: Secondary | ICD-10-CM | POA: Insufficient documentation

## 2019-09-22 DIAGNOSIS — E7800 Pure hypercholesterolemia, unspecified: Secondary | ICD-10-CM | POA: Diagnosis not present

## 2019-09-22 DIAGNOSIS — Z20822 Contact with and (suspected) exposure to covid-19: Secondary | ICD-10-CM | POA: Insufficient documentation

## 2019-09-22 LAB — SARS CORONAVIRUS 2 (TAT 6-24 HRS): SARS Coronavirus 2: NEGATIVE

## 2019-09-22 MED ORDER — AMOXICILLIN-POT CLAVULANATE 875-125 MG PO TABS: 1.0000 | ORAL_TABLET | Freq: Two times a day (BID) | ORAL | 0 refills | Status: DC

## 2019-09-22 NOTE — ED Provider Notes (Signed)
[facility]    CSN: [id number] Arrival date & time: 09/22/19  0836      History   Chief Complaint Chief Complaint  Patient presents with  . Sinus Problem   HPI  59 year old male presents with the above complaints.  Patient reports that he has been sick for nearly 3 weeks.  Reports sinus pain and pressure as well as purulent nasal discharge.  Associated popping in the ears.  He has had some dizziness/lightheadedness.  He states that he has failed to improve despite it being nearly 3 weeks.  No reported sick contacts.  No relieving factors.  No other associated symptoms.  No other complaints.  Past Medical History:  Diagnosis Date  . High cholesterol    Patient Active Problem List   Diagnosis Date Noted  . Plantar wart 08/28/2016   Past Surgical History:  Procedure Laterality Date  . CHOLECYSTECTOMY      Home Medications    Prior to Admission medications   Medication Sig Start Date End Date Taking? Authorizing Provider  aspirin 81 MG EC tablet Take 81 mg by mouth daily.     Yes [provider]  cetirizine (ZYRTEC) 10 MG tablet Take 10 mg by mouth daily.   Yes [provider]  LIVALO 4 MG TABS Take 1 tablet by mouth daily. 09/16/19  Yes [provider]  NONFORMULARY OR COMPOUNDED ITEM [facility]:  Wart cream - Cimetidine 2%, Deoxy-D-Glucose 0.2%, Fluorouracil-5 5%, Salicylic Acid 15%, apply to affected area daily. 07/25/16  Yes Vivi Barrack, DPM  Nutritional Supplements (PROSTA VITE PO) Take 1 tablet by mouth daily.     Yes [provider]  Pitavastatin Calcium (LIVALO) 2 MG TABS Take 1 tablet by mouth daily.     Yes [provider]  ezetimibe (ZETIA) 10 MG tablet Take 10 mg by mouth daily.    09/22/19 Yes [provider]  amoxicillin-clavulanate (AUGMENTIN) 875-125 MG tablet Take 1 tablet by mouth every 12 (twelve) hours. 09/22/19   Tommie Sams, DO    Social History Social History   Tobacco Use   . Smoking status: Former Games developer  . Smokeless tobacco: Never Used  Substance Use Topics  . Alcohol use: Yes  . Drug use: No     Allergies   Niaspan [niacin er]   Review of Systems Review of Systems  Constitutional: Negative.   HENT: Positive for congestion, sinus pressure and sinus pain.    Physical Exam Triage Vital Signs ED Triage Vitals  Enc Vitals Group     BP 09/22/19 0854 132/85     Pulse Rate 09/22/19 0854 73     Resp 09/22/19 0854 18     Temp 09/22/19 0854 98.4 F (36.9 C)     Temp Source 09/22/19 0854 Oral     SpO2 09/22/19 0854 99 %     Weight 09/22/19 0852 210 lb (95.3 kg)     Height 09/22/19 0852 6' (1.829 m)     Head Circumference --      Peak Flow --      Pain Score 09/22/19 0851 0     Pain Loc --      Pain Edu? --      Excl. in GC? --    Updated Vital Signs BP 132/85 (BP Location: Left Arm)   Pulse 73   Temp 98.4 F (36.9 C) (Oral)   Resp 18   Ht 6' (1.829 m)   Wt 95.3 kg  SpO2 99%   BMI 28.48 kg/m   Visual Acuity Right Eye Distance:   Left Eye Distance:   Bilateral Distance:    Right Eye Near:   Left Eye Near:    Bilateral Near:     Physical Exam Vitals and nursing note reviewed.  Constitutional:      General: He is not in acute distress.    Appearance: Normal appearance. He is not ill-appearing.  HENT:     Head: Normocephalic and atraumatic.     Right Ear: Tympanic membrane normal.     Left Ear: Tympanic membrane normal.     Nose: Congestion present. No rhinorrhea.  Eyes:     General:        Right eye: No discharge.        Left eye: No discharge.     Conjunctiva/sclera: Conjunctivae normal.  Cardiovascular:     Rate and Rhythm: Normal rate and regular rhythm.  Pulmonary:     Effort: Pulmonary effort is normal.     Breath sounds: Normal breath sounds. No wheezing, rhonchi or rales.  Neurological:     Mental Status: He is alert.  Psychiatric:        Mood and Affect: Mood normal.        Behavior: Behavior normal.     UC Treatments / Results  Labs (all labs ordered are listed, but only abnormal results are displayed) Labs Reviewed  SARS CORONAVIRUS 2 (TAT 6-24 HRS)    EKG   Radiology No results found.  Procedures Procedures (including critical care time)  Medications Ordered in UC Medications - No data to display  Initial Impression / Assessment and Plan / UC Course  I have reviewed the triage vital signs and the nursing notes.  Pertinent labs & imaging results that were available during my care of the patient were reviewed by me and considered in my medical decision making (see chart for details).    59 year old male presents with sinusitis.  Awaiting Covid test result.  Augmentin as prescribed.  Final Clinical Impressions(s) / UC Diagnoses   Final diagnoses:  Acute sinusitis, recurrence not specified, unspecified location     Discharge Instructions     Antibiotic as prescribed.  COVID test results available in 24 to 48 hours.  Take care  Dr. Lacinda Axon     ED Prescriptions    Medication Sig Dispense Auth. Provider   amoxicillin-clavulanate (AUGMENTIN) 875-125 MG tablet Take 1 tablet by mouth every 12 (twelve) hours. 20 tablet Coral Spikes, DO     PDMP not reviewed this encounter.   Thersa [facility], [place] 09/22/19 [phone]

## 2019-09-22 NOTE — ED Triage Notes (Signed)
Pt presents with c/o sinus congestion for several weeks. He reports he has green drainage, sometimes with light blood, along with sinus pain/pressure, ear crackling sounds and some dizziness. He states that he seems to be less congested during the day. He did have a sore throat early on but that has improved. He denies cough, fever/chills, n/v/d or other symptoms. He has had the first COVID vaccine.

## 2019-09-22 NOTE — Discharge Instructions (Signed)
Antibiotic as prescribed.  COVID test results available in 24 to 48 hours.  Take care  Dr. Adriana Simas

## 2020-08-05 ENCOUNTER — Other Ambulatory Visit: Payer: Self-pay

## 2020-08-05 DIAGNOSIS — Z20822 Contact with and (suspected) exposure to covid-19: Secondary | ICD-10-CM

## 2020-08-07 LAB — SARS-COV-2, NAA 2 DAY TAT

## 2020-08-07 LAB — SPECIMEN STATUS REPORT

## 2020-08-07 LAB — NOVEL CORONAVIRUS, NAA: SARS-CoV-2, NAA: NOT DETECTED

## 2020-11-03 ENCOUNTER — Ambulatory Visit
Admission: EM | Admit: 2020-11-03 | Discharge: 2020-11-03 | Disposition: A | Discharge status: Home / Self Care | Payer: BC Managed Care – PPO | Attending: Physician Assistant | Admitting: Physician Assistant

## 2020-11-03 ENCOUNTER — Other Ambulatory Visit: Payer: Self-pay

## 2020-11-03 ENCOUNTER — Encounter: Payer: Self-pay | Admitting: Emergency Medicine

## 2020-11-03 DIAGNOSIS — S61431A Puncture wound without foreign body of right hand, initial encounter: Secondary | ICD-10-CM | POA: Diagnosis not present

## 2020-11-03 DIAGNOSIS — Z23 Encounter for immunization: Secondary | ICD-10-CM

## 2020-11-03 MED ORDER — TETANUS-DIPHTH-ACELL PERTUSSIS 5-2.5-18.5 LF-MCG/0.5 IM SUSY
0.5000 mL | PREFILLED_SYRINGE | Freq: Once | INTRAMUSCULAR | Status: AC
  Administered 2020-11-03: 0.5 mL via INTRAMUSCULAR

## 2020-11-03 NOTE — Discharge Instructions (Addendum)
Keep wound clean and dry and cover it til you get a scab You may ice the area of bruising.

## 2020-11-03 NOTE — ED Provider Notes (Signed)
[facility]    CSN: [id number] Arrival date & time: 11/03/20  1707      History   Chief Complaint Chief Complaint  Patient presents with  . Puncture Wound    Left hand    HPI Darren Martin is a 60 y.o. male who was cutting some plastic with his R hand and accidentally punctured his L thenar region. Had moderate bleedding. His last TD was > 10 y ago.     Past Medical History:  Diagnosis Date  . High cholesterol     Patient Active Problem List   Diagnosis Date Noted  . Plantar wart 08/28/2016    Past Surgical History:  Procedure Laterality Date  . CHOLECYSTECTOMY         Home Medications    Prior to Admission medications   Medication Sig Start Date End Date Taking? Authorizing Provider  aspirin 81 MG EC tablet Take 81 mg by mouth daily.   Yes [provider]  cetirizine (ZYRTEC) 10 MG tablet Take 10 mg by mouth daily.   Yes [provider]  LIVALO 4 MG TABS Take 1 tablet by mouth daily. 09/16/19  Yes [provider]  Nutritional Supplements (PROSTA VITE PO) Take 1 tablet by mouth daily.   Yes [provider]  Pitavastatin Calcium 2 MG TABS Take 1 tablet by mouth daily.   Yes [provider]  amoxicillin-clavulanate (AUGMENTIN) 875-125 MG tablet Take 1 tablet by mouth every 12 (twelve) hours. 09/22/19   Darren Sams, DO  NONFORMULARY OR COMPOUNDED ITEM [facility]:  Wart cream - Cimetidine 2%, Deoxy-D-Glucose 0.2%, Fluorouracil-5 5%, Salicylic Acid 15%, apply to affected area daily. 07/25/16   Darren Martin, DPM  ezetimibe (ZETIA) 10 MG tablet Take 10 mg by mouth daily.    09/22/19  [provider]    Family History History reviewed. No pertinent family history.  Social History Social History   Tobacco Use  . Smoking status: Former Games developer  . Smokeless tobacco: Never Used  Vaping Use  . Vaping Use: Never used  Substance Use Topics  . Alcohol use: Yes  . Drug use: No      Allergies   Niaspan [niacin er]   Review of Systems Review of Systems  Skin: Positive for color change and wound.  Neurological: Negative for numbness.       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 --      Excl. in GC? --    No data found.  Updated Vital Signs BP 101/87 (BP Location: Left Arm)   Pulse 97   Temp 97.8 F (36.6 C) (Oral)   Resp 18  Ht 5\' 11"  (1.803 m)   Wt 210 lb 1.6 oz (95.3 kg)   SpO2 98%   BMI 29.30 kg/m   Visual Acuity Right Eye Distance:   Left Eye Distance:   Bilateral Distance:    Right Eye Near:   Left Eye Near:    Bilateral Near:     Physical Exam Vitals and nursing note reviewed.  Constitutional:      General: He is not in acute distress.    Appearance: He is not toxic-appearing.  HENT:     Right Ear: External ear normal.     Left Ear: External ear normal.  Eyes:     General: No scleral icterus.    Conjunctiva/sclera: Conjunctivae normal.  Pulmonary:     Effort: Pulmonary effort is normal.  Musculoskeletal:        General: Normal range of motion.     Cervical back: Neck supple.  Skin:    General: Skin is warm and dry.     Findings: No rash.     Comments: L HAND- with 4 mm puncture wound which is clean, but has ecchymosis under. The bones below are not tender. Has normal ROM of all fingers.   Neurological:     Mental Status: He is alert and oriented to person, place, and time.     Gait: Gait normal.  Psychiatric:         Mood and Affect: Mood normal.        Behavior: Behavior normal.        Thought Content: Thought content normal.        Judgment: Judgment normal.      UC Treatments / Results  Labs (all labs ordered are listed, but only abnormal results are displayed) Labs Reviewed - No data to display  EKG   Radiology No results found.  Procedures Procedures (including critical care time)  Medications Ordered in UC Medications  Tdap (BOOSTRIX) injection 0.5 mL (0.5 mLs Intramuscular Given 11/03/20 1728)    Initial Impression / Assessment and Plan / UC Course  I have reviewed the triage vital signs and the nursing notes. Puncture wound L hand. Antibiotic ointment applied and bandaid with coban to hold it together. TDAP was given.  See instructions  Final Clinical Impressions(s) / UC Diagnoses   Final diagnoses:  None   Discharge Instructions   None    ED Prescriptions    None     PDMP not reviewed this encounter.   11/05/20, Garey Ham 11/03/20 1811

## 2020-11-03 NOTE — ED Triage Notes (Signed)
Pt has puncture wound on his left palm. He states he jabbed a knife in his hand today. Last tetanus was at least 10 years ago.
